# Patient Record
Sex: Female | Born: 1975 | Race: White | Hispanic: No | Marital: Married
Health system: Southern US, Community
[De-identification: ages and names within clinical notes are randomized; demographics above are authoritative.]

---

## 2005-11-01 ENCOUNTER — Ambulatory Visit: Payer: Self-pay | Admitting: Unknown Physician Specialty

## 2005-11-10 ENCOUNTER — Ambulatory Visit: Payer: Self-pay | Admitting: Unknown Physician Specialty

## 2008-04-12 IMAGING — CT CT ABD-PELV W/ CM
1 of 2 series · 15 of 32 positions shown, 19 images · non-contrast
Comparison: none

REASON FOR EXAM: Abdominal pain, LLQ pain Heme positive stool
COMMENTS:

PROCEDURE:     CT  - CT ABDOMEN / PELVIS  W  - November 01, 2005  [DATE]
RESULT:
REASON FOR CONSULTATION:  Abdominal pain in the LEFT lower quadrant.
TECHNIQUE: Axial images were obtained from the hemidiaphragm to the pubic
symphysis post intravenous and oral administration of contrast material.

[Series 2: abdomen · axial · 0.60mm/px · z∈[-526,-166]mm · 15 of 49 slices shown, 19 images]
[im 2/49  soft-tissue]
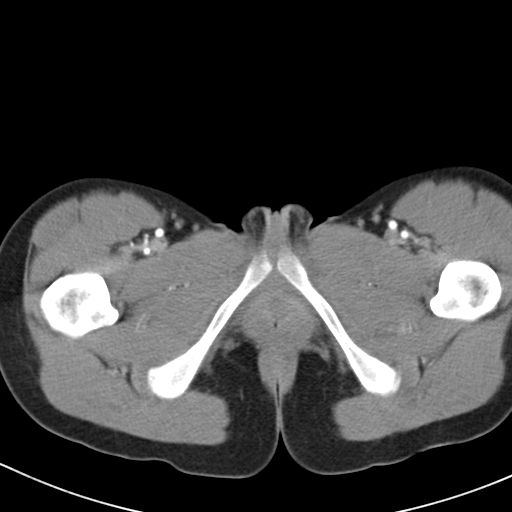
[im 2/49  bone]
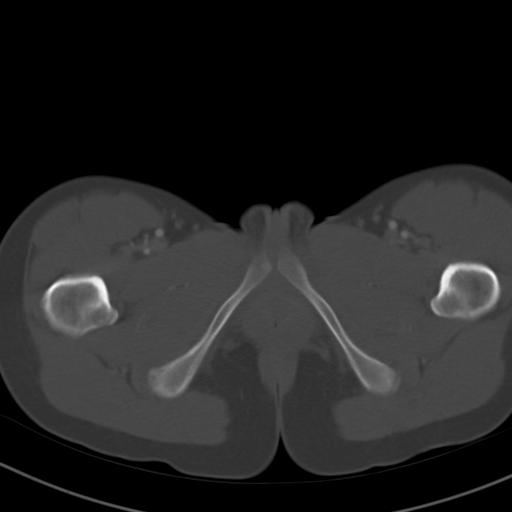
[im 6/49  soft-tissue]
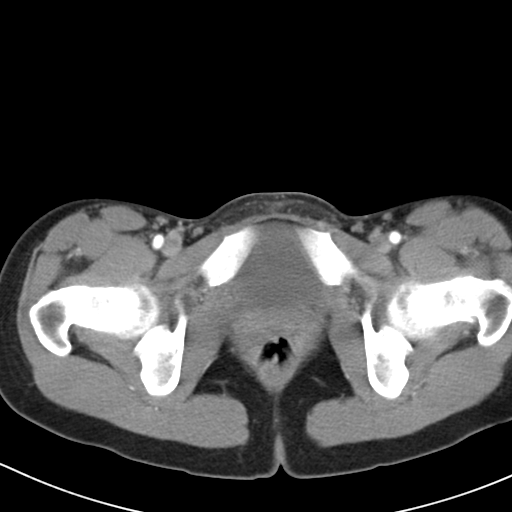
[im 10/49  soft-tissue]
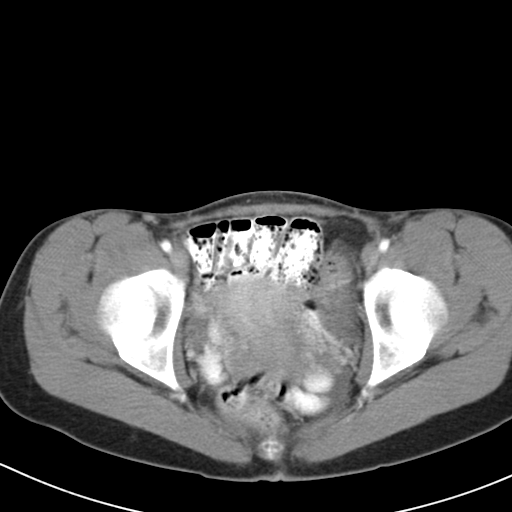
[im 14/49  soft-tissue]
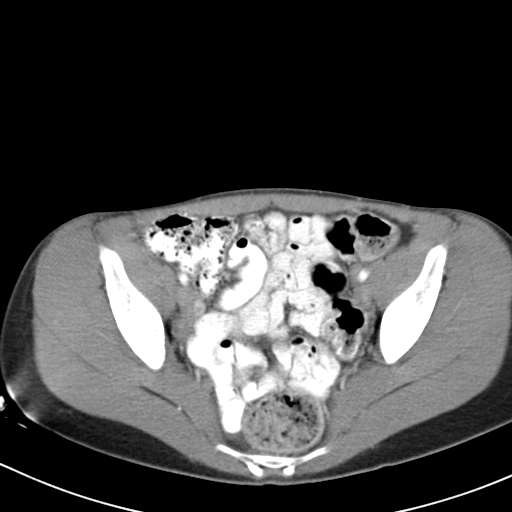
[im 18/49  soft-tissue]
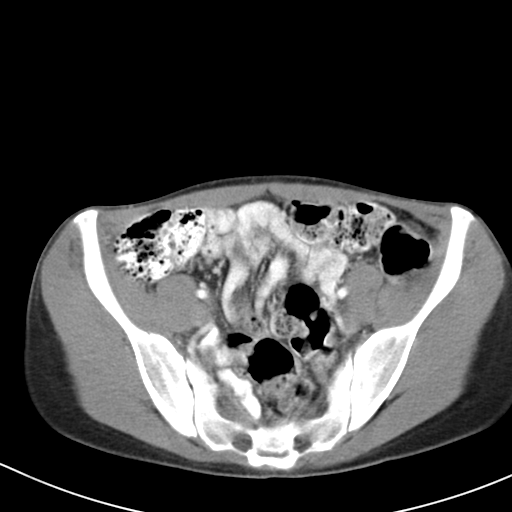
[im 22/49  soft-tissue]
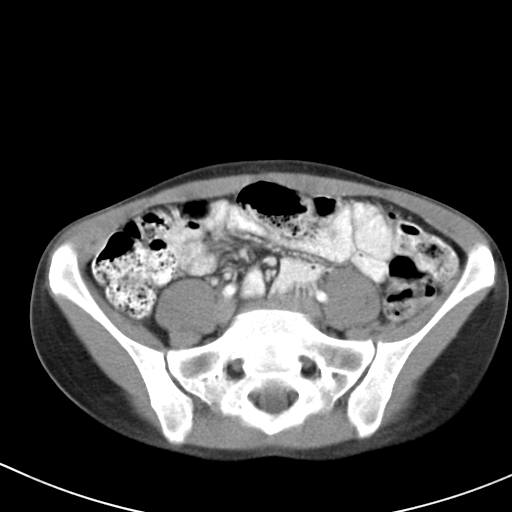
[im 25/49  soft-tissue]
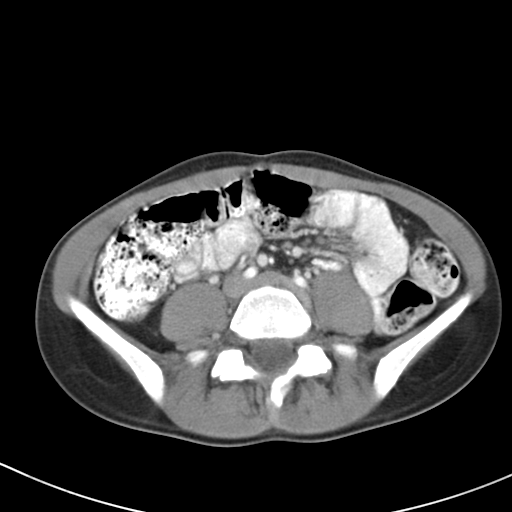
[im 27/49  soft-tissue]
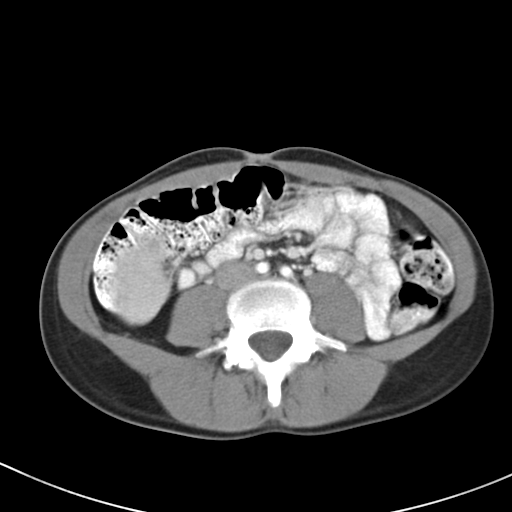
[im 31/49  soft-tissue]
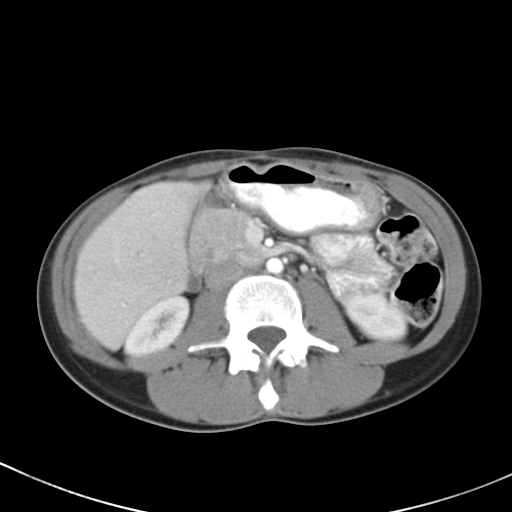
[im 31/49  bone]
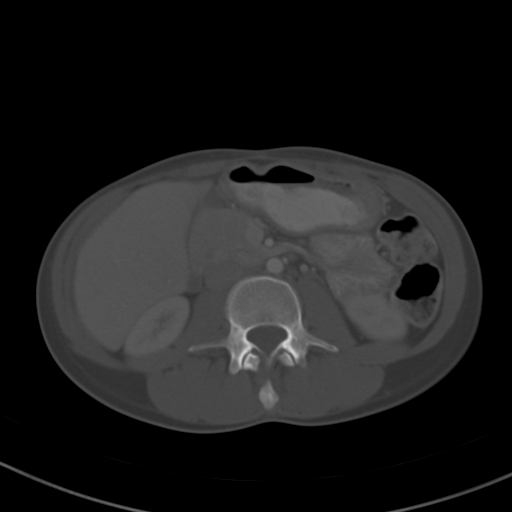
[im 35/49  soft-tissue]
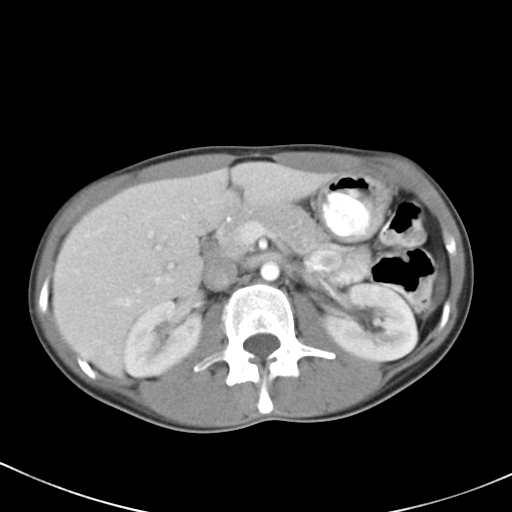
[im 39/49  soft-tissue]
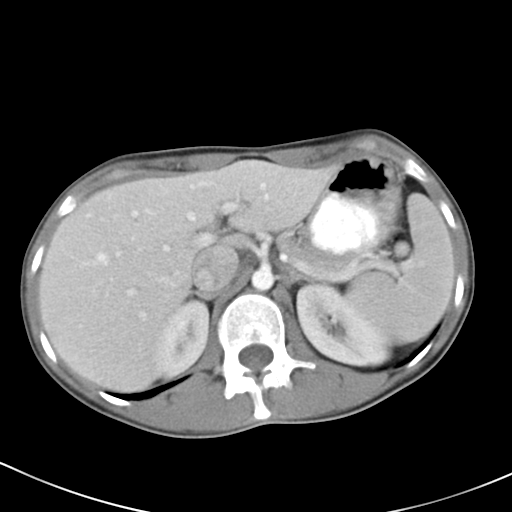
[im 41/49  lung]
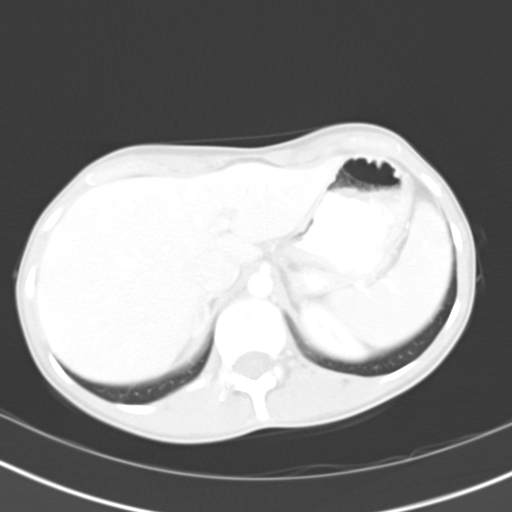
[im 43/49  soft-tissue]
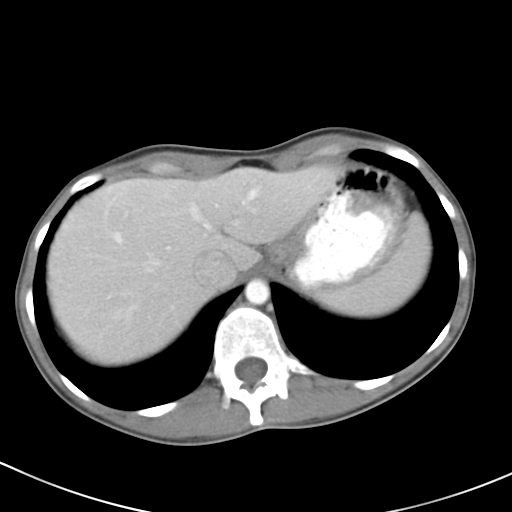
[im 43/49  lung]
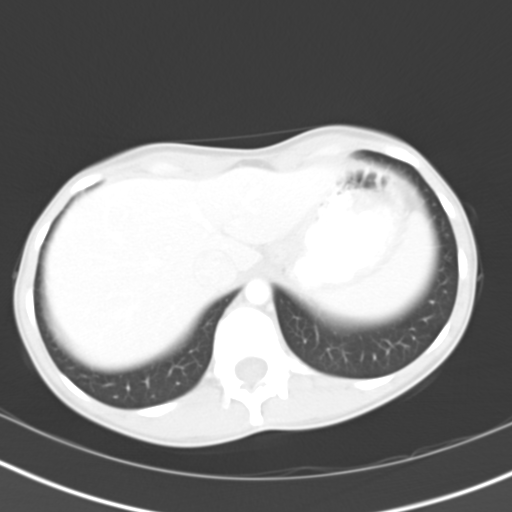
[im 45/49  lung]
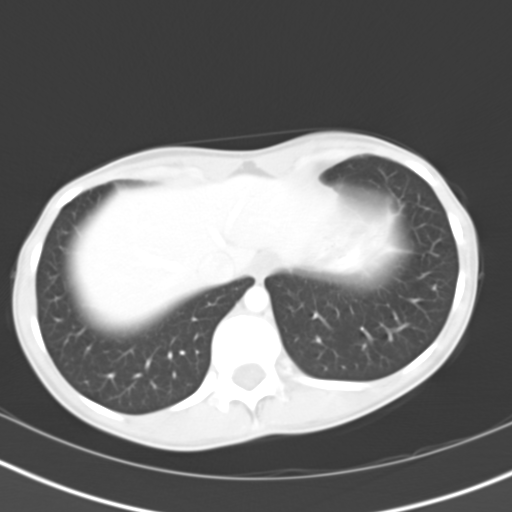
[im 47/49  soft-tissue]
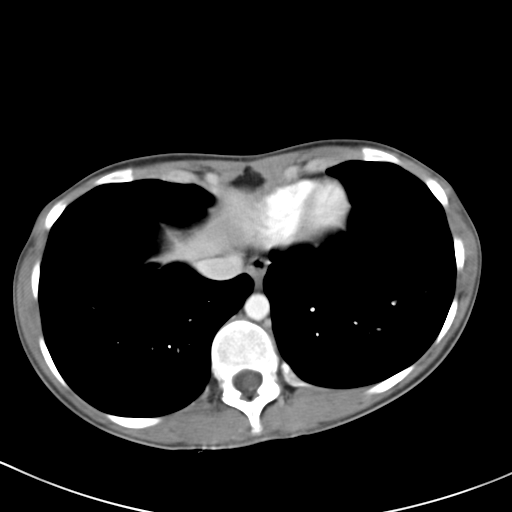
[im 47/49  lung]
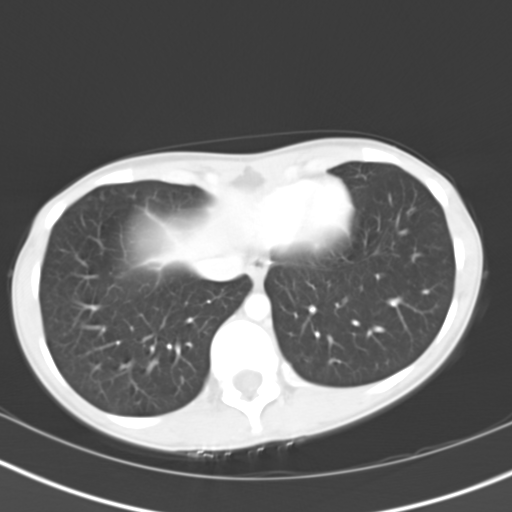

[15 of 32 positions shown; findings below may reference images not displayed]

FINDINGS: The liver and spleen appear intact.  No focal abnormalities are
identified.

Both kidneys excrete the contrast material with no masses seen.  No
hydronephrosis is identified.  No ascites in the abdomen or pelvis is
identified.  No evidence of diverticulitis.  The appendix is not seen per
se, however, no definite fluid is identified to suggest appendicitis.
Possible small RIGHT ovarian cyst is noted.  No intraabdominal or pelvic
masses.  No retroperitoneal or pelvic adenopathy is seen.

The images in the lung bases reveal the lung bases to be clear with no
effusions.
IMPRESSION: No evidence of diverticulitis.

Possible small RIGHT ovarian cyst.  Otherwise, the pelvis appears
unremarkable.

## 2019-01-09 ENCOUNTER — Telehealth: Payer: Self-pay

## 2019-01-09 NOTE — Telephone Encounter (Signed)
Unable to leave message- line busy.
